# Patient Record
Sex: Male | Born: 1980 | Hispanic: No | Marital: Married | Smoking: Never smoker
Health system: Southern US, Community
[De-identification: ages and names within clinical notes are randomized; demographics above are authoritative.]

## PROBLEM LIST (undated history)

## (undated) HISTORY — PX: OTHER SURGICAL HISTORY: SHX169

## (undated) HISTORY — PX: NASAL SEPTUM SURGERY: SHX37

---

## 2011-10-10 ENCOUNTER — Emergency Department (HOSPITAL_COMMUNITY): Payer: PRIVATE HEALTH INSURANCE

## 2011-10-10 ENCOUNTER — Emergency Department (HOSPITAL_COMMUNITY)
Admission: EM | Admit: 2011-10-10 | Discharge: 2011-10-10 | Disposition: A | Discharge status: Home / Self Care | Payer: PRIVATE HEALTH INSURANCE | Attending: Emergency Medicine | Admitting: Emergency Medicine

## 2011-10-10 DIAGNOSIS — Z7982 Long term (current) use of aspirin: Secondary | ICD-10-CM | POA: Insufficient documentation

## 2011-10-10 DIAGNOSIS — R079 Chest pain, unspecified: Secondary | ICD-10-CM | POA: Insufficient documentation

## 2011-10-10 LAB — BASIC METABOLIC PANEL WITH GFR
CO2: 27 meq/L (ref 19–32)
Calcium: 10 mg/dL (ref 8.4–10.5)
Creatinine, Ser: 0.86 mg/dL (ref 0.50–1.35)
GFR calc Af Amer: >90 mL/min (ref >90)
Glucose, Bld: 99 mg/dL (ref 70–99)
Potassium: 4 meq/L (ref 3.5–5.1)

## 2011-10-10 LAB — DIFFERENTIAL
Basophils Absolute: 0 K/uL (ref 0.0–0.1)
Basophils Relative: 0 % (ref 0–1)
Eosinophils Absolute: 0.1 10*3/uL (ref 0.0–0.7)
Eosinophils Relative: 1 % (ref 0–5)
Lymphocytes Relative: 22 % (ref 12–46)
Lymphs Abs: 2.8 10*3/uL (ref 0.7–4.0)
Monocytes Absolute: 0.8 K/uL (ref 0.1–1.0)
Monocytes Relative: 6 % (ref 3–12)
Neutro Abs: 8.7 K/uL — ABNORMAL HIGH (ref 1.7–7.7)
Neutrophils Relative %: 71 % (ref 43–77)

## 2011-10-10 LAB — CBC
HCT: 44.7 % (ref 39.0–52.0)
Hemoglobin: 16.3 g/dL (ref 13.0–17.0)
MCH: 29.4 pg (ref 26.0–34.0)
MCHC: 36.5 g/dL — ABNORMAL HIGH (ref 30.0–36.0)
MCV: 80.7 fL (ref 78.0–100.0)
Platelets: 197 10*3/uL (ref 150–400)
RBC: 5.54 MIL/uL (ref 4.22–5.81)
RDW: 12.5 % (ref 11.5–15.5)
WBC: 12.3 10*3/uL — ABNORMAL HIGH (ref 4.0–10.5)

## 2011-10-10 LAB — BASIC METABOLIC PANEL
BUN: 14 mg/dL (ref 6–23)
Chloride: 101 mEq/L (ref 96–112)
GFR calc non Af Amer: >90 mL/min (ref >90)
Sodium: 138 mEq/L (ref 135–145)

## 2011-10-10 LAB — POCT I-STAT TROPONIN I: Troponin i, poc: 0 ng/mL (ref 0.00–0.08)

## 2015-04-03 ENCOUNTER — Telehealth: Payer: Self-pay | Admitting: Internal Medicine

## 2015-04-03 NOTE — Telephone Encounter (Signed)
yes

## 2015-04-03 NOTE — Telephone Encounter (Signed)
[facility]  He needs a compass referral to an ENT that he normally sees.  He does not have a primary care provider and is looking to establish care soon.  Is there any way you can take this patient on?

## 2015-04-04 NOTE — Telephone Encounter (Signed)
Left voicemail for patient to call back to schedule appointment.

## 2020-09-04 ENCOUNTER — Ambulatory Visit
Admission: RE | Admit: 2020-09-04 | Discharge: 2020-09-04 | Disposition: A | Discharge status: Home / Self Care | Payer: 59 | Source: Ambulatory Visit | Attending: Internal Medicine | Admitting: Internal Medicine

## 2020-09-04 ENCOUNTER — Other Ambulatory Visit: Payer: Self-pay

## 2020-09-04 ENCOUNTER — Other Ambulatory Visit: Payer: Self-pay | Admitting: Internal Medicine

## 2020-09-04 DIAGNOSIS — Z111 Encounter for screening for respiratory tuberculosis: Secondary | ICD-10-CM

## 2021-02-10 IMAGING — CR DG CHEST 2V
2 series · 2 of 2 positions shown · non-contrast
Comparison: Chest x-ray 10/10/2011.

CLINICAL DATA: 38-year-old male with history of positive TB test.
Currently asymptomatic.

EXAM:
CHEST - 2 VIEW

[w chest pa]
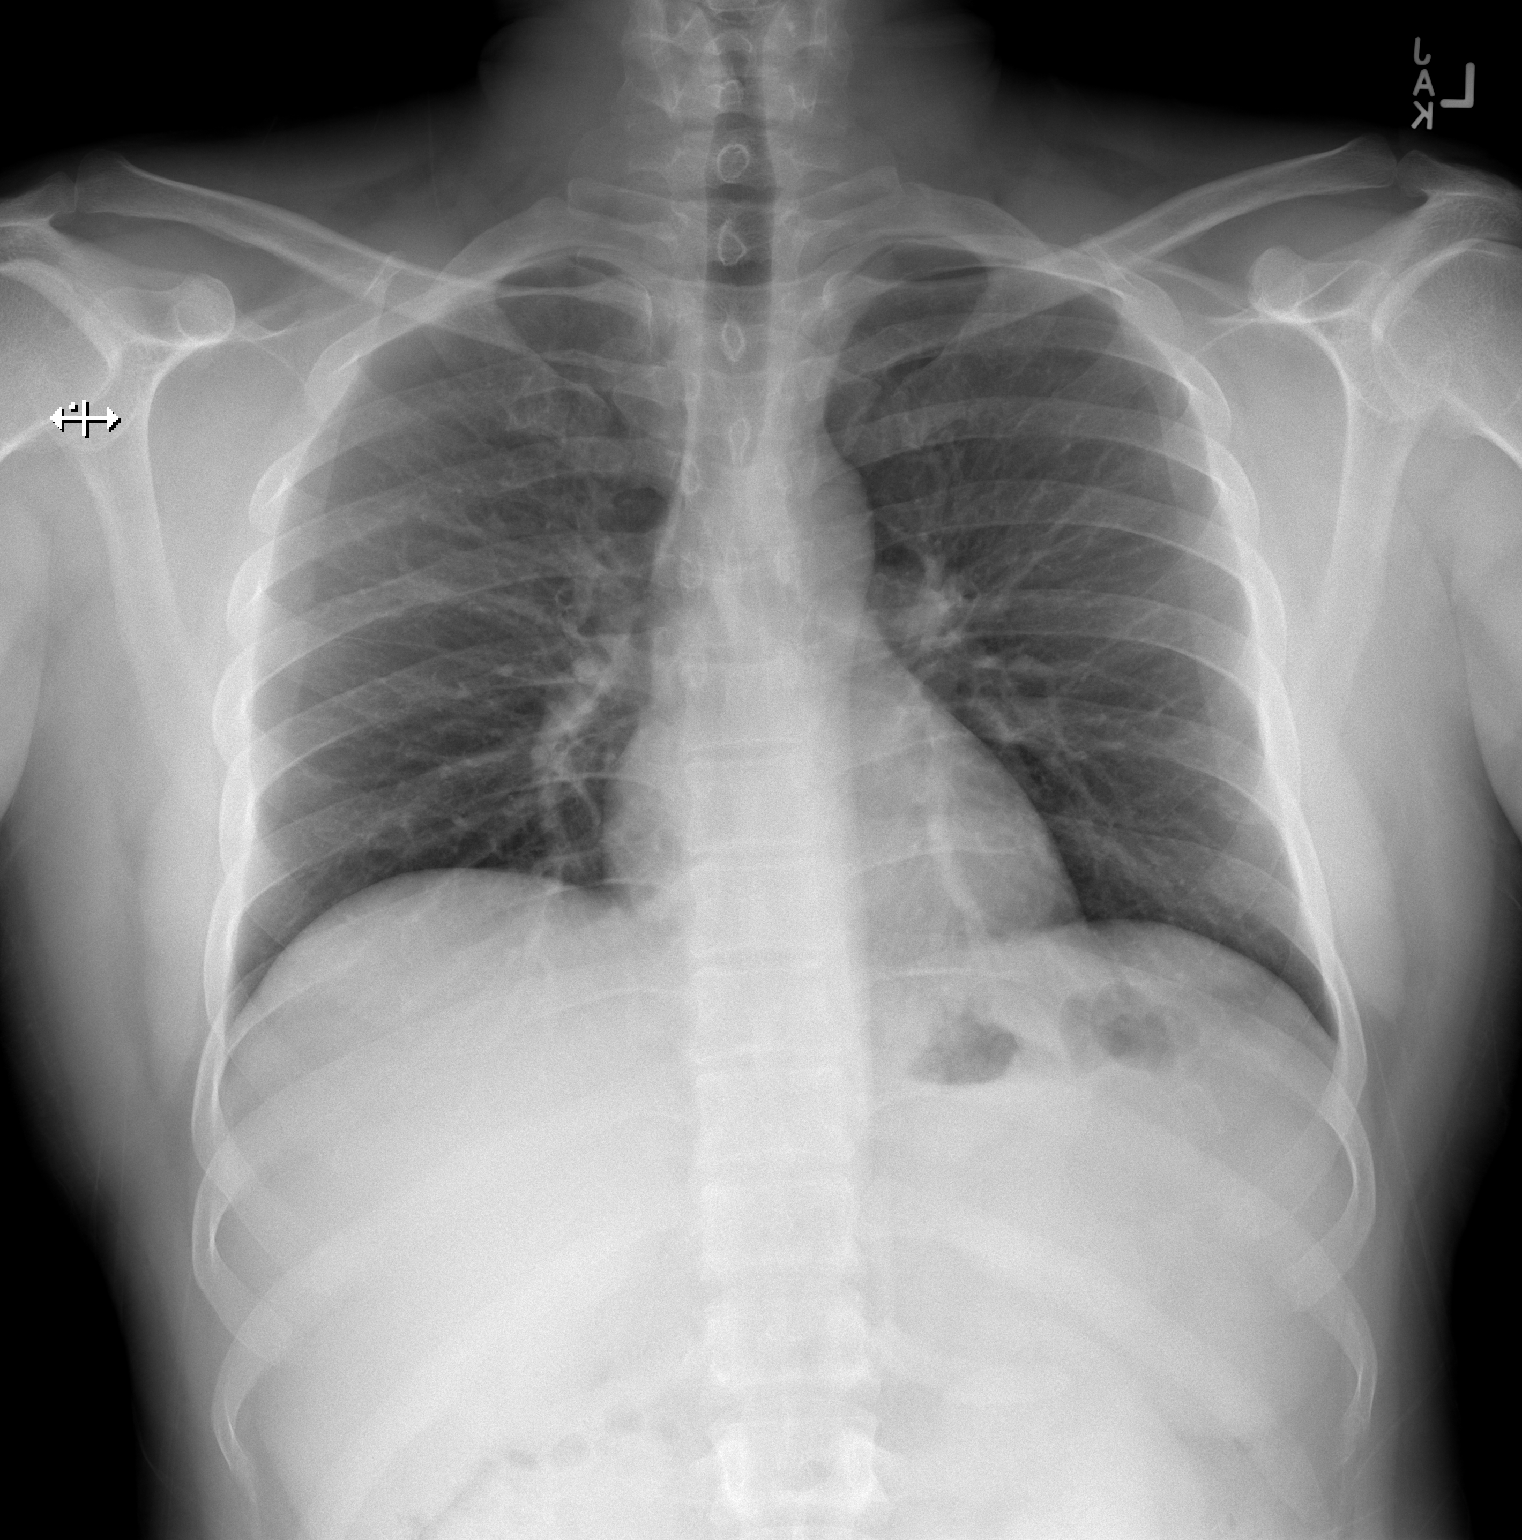

[w chest lat]
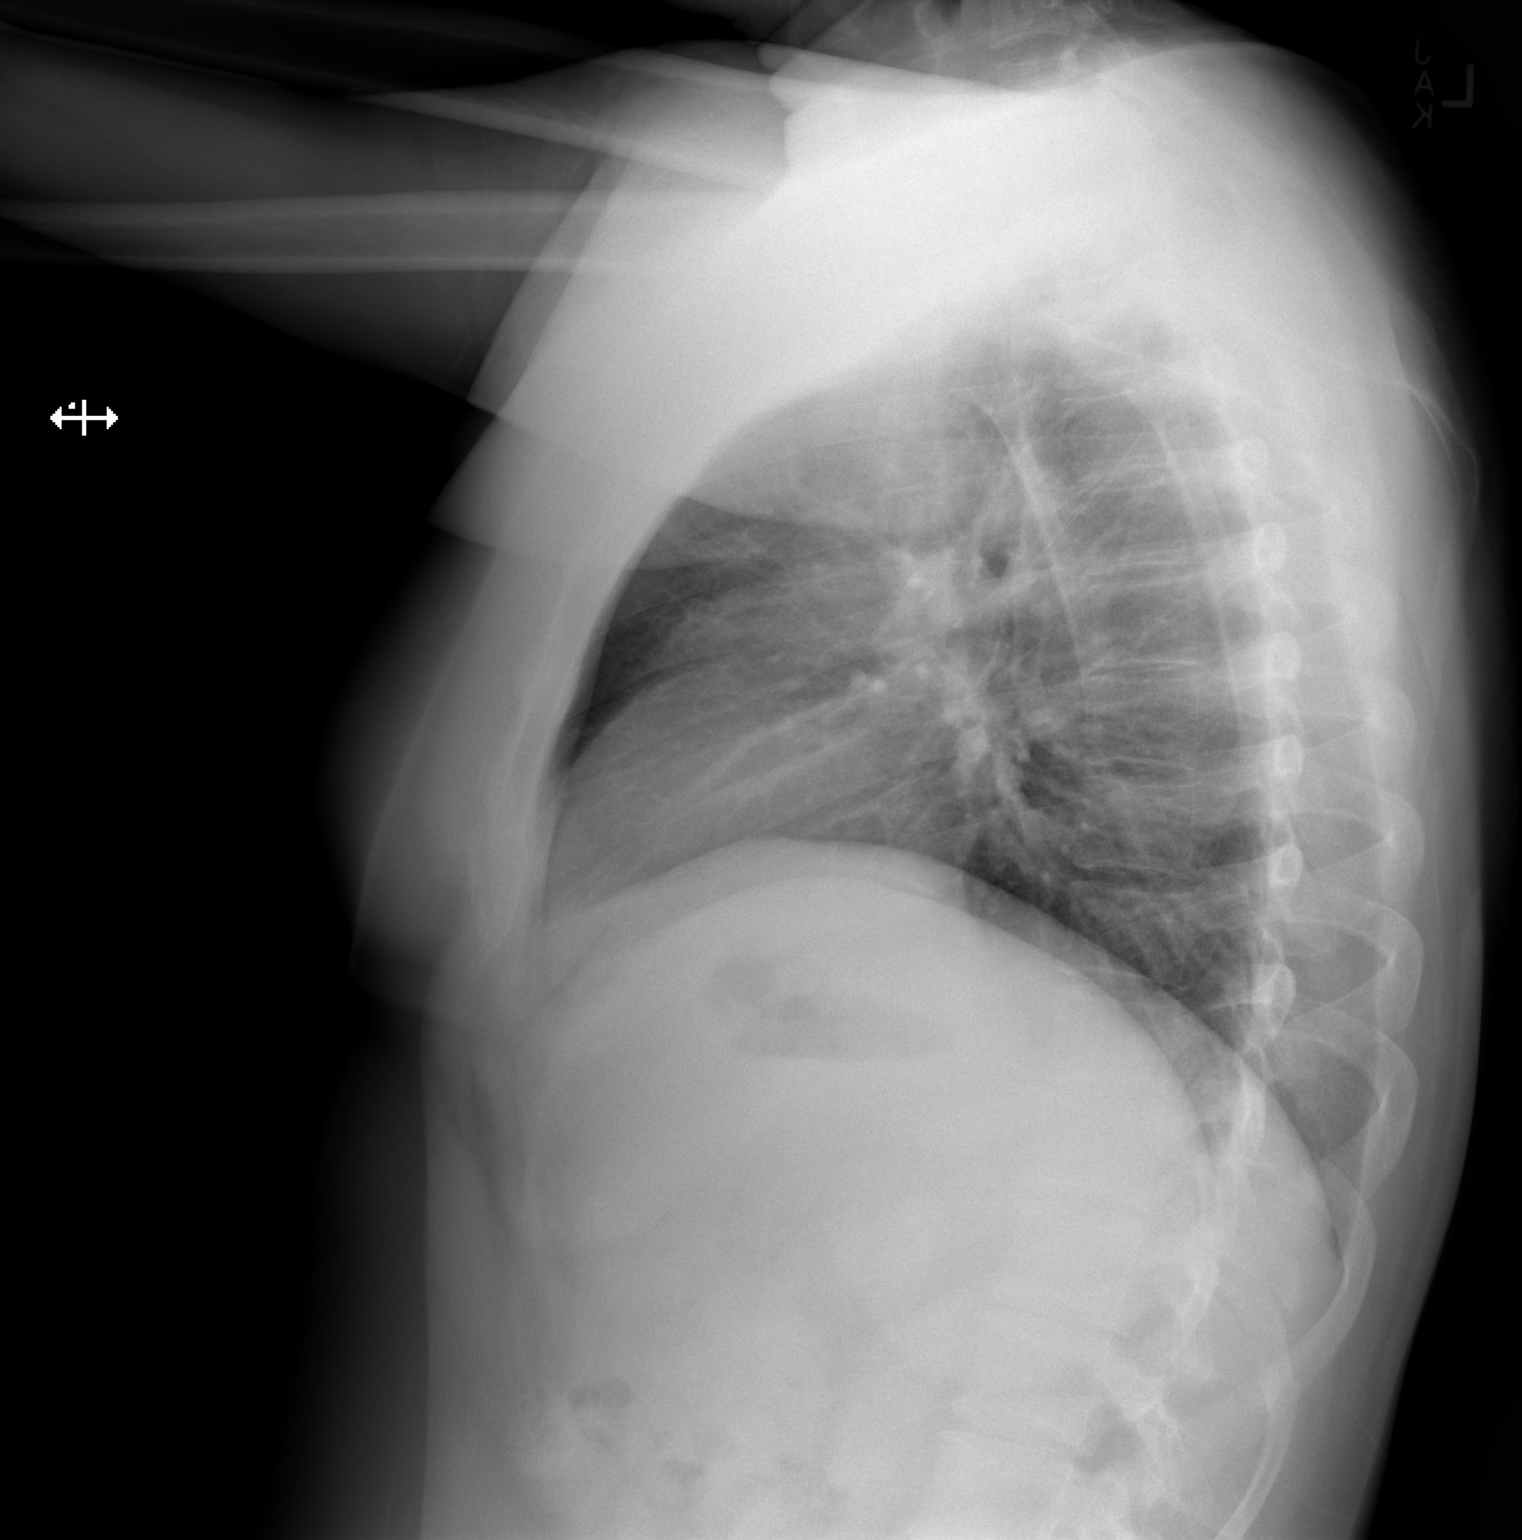

[2 of 2 positions shown; findings below may reference images not displayed]

FINDINGS: Lung volumes are normal. No consolidative airspace disease. No
pleural effusions. No pneumothorax. No pulmonary nodule or mass
noted. Pulmonary vasculature and the cardiomediastinal silhouette
are within normal limits.
IMPRESSION: 1. No radiographic evidence of active intrathoracic tuberculosis.

## 2024-06-28 ENCOUNTER — Ambulatory Visit

## 2024-06-28 ENCOUNTER — Telehealth: Payer: Self-pay

## 2024-06-28 ENCOUNTER — Ambulatory Visit: Payer: Self-pay | Admitting: Medical

## 2024-06-28 ENCOUNTER — Encounter: Payer: Self-pay | Admitting: Medical

## 2024-06-28 ENCOUNTER — Ambulatory Visit (INDEPENDENT_AMBULATORY_CARE_PROVIDER_SITE_OTHER): Admitting: Medical

## 2024-06-28 VITALS — BP 126/82 | HR 90 | Resp 18 | Ht 71.0 in | Wt 206.0 lb

## 2024-06-28 DIAGNOSIS — Z0001 Encounter for general adult medical examination with abnormal findings: Secondary | ICD-10-CM | POA: Diagnosis not present

## 2024-06-28 DIAGNOSIS — R5383 Other fatigue: Secondary | ICD-10-CM | POA: Diagnosis not present

## 2024-06-28 DIAGNOSIS — R739 Hyperglycemia, unspecified: Secondary | ICD-10-CM

## 2024-06-28 DIAGNOSIS — G629 Polyneuropathy, unspecified: Secondary | ICD-10-CM

## 2024-06-28 DIAGNOSIS — M25562 Pain in left knee: Secondary | ICD-10-CM

## 2024-06-28 DIAGNOSIS — R0789 Other chest pain: Secondary | ICD-10-CM | POA: Diagnosis not present

## 2024-06-28 DIAGNOSIS — Z13220 Encounter for screening for lipoid disorders: Secondary | ICD-10-CM

## 2024-06-28 DIAGNOSIS — Z0000 Encounter for general adult medical examination without abnormal findings: Secondary | ICD-10-CM

## 2024-06-28 LAB — CBC WITH DIFFERENTIAL/PLATELET
Basophils Absolute: 0 10*3/uL (ref 0.0–0.1)
Basophils Relative: 0.3 % (ref 0.0–3.0)
Eosinophils Absolute: 0.1 10*3/uL (ref 0.0–0.7)
Eosinophils Relative: 1 % (ref 0.0–5.0)
HCT: 46.8 % (ref 39.0–52.0)
Hemoglobin: 16 g/dL (ref 13.0–17.0)
Lymphocytes Relative: 28.8 % (ref 12.0–46.0)
Lymphs Abs: 1.8 10*3/uL (ref 0.7–4.0)
MCHC: 34.2 g/dL (ref 30.0–36.0)
MCV: 82.8 fl (ref 78.0–100.0)
Monocytes Absolute: 0.4 10*3/uL (ref 0.1–1.0)
Monocytes Relative: 6.5 % (ref 3.0–12.0)
Neutro Abs: 4 10*3/uL (ref 1.4–7.7)
Neutrophils Relative %: 63.4 % (ref 43.0–77.0)
Platelets: 239 10*3/uL (ref 150.0–400.0)
RBC: 5.65 Mil/uL (ref 4.22–5.81)
RDW: 13.5 % (ref 11.5–15.5)
WBC: 6.4 10*3/uL (ref 4.0–10.5)

## 2024-06-28 LAB — VITAMIN B12: Vitamin B-12: 179 pg/mL — ABNORMAL LOW (ref 211–911)

## 2024-06-28 LAB — TROPONIN I (HIGH SENSITIVITY): High Sens Troponin I: <2 ng/L (ref 2–17)

## 2024-06-28 LAB — COMPREHENSIVE METABOLIC PANEL WITH GFR
ALT: 38 U/L (ref 0–53)
AST: 23 U/L (ref 0–37)
Albumin: 4.8 g/dL (ref 3.5–5.2)
Alkaline Phosphatase: 45 U/L (ref 39–117)
BUN: 13 mg/dL (ref 6–23)
CO2: 27 meq/L (ref 19–32)
Calcium: 9.5 mg/dL (ref 8.4–10.5)
Chloride: 106 meq/L (ref 96–112)
Creatinine, Ser: 0.91 mg/dL (ref 0.40–1.50)
GFR: 105.28 mL/min (ref >60.00)
Glucose, Bld: 103 mg/dL — ABNORMAL HIGH (ref 70–99)
Potassium: 4.4 meq/L (ref 3.5–5.1)
Sodium: 139 meq/L (ref 135–145)
Total Bilirubin: 0.8 mg/dL (ref 0.2–1.2)
Total Protein: 7.8 g/dL (ref 6.0–8.3)

## 2024-06-28 LAB — LIPID PANEL
Cholesterol: 182 mg/dL (ref 0–200)
HDL: 47.6 mg/dL (ref >39.00)
LDL Cholesterol: 114 mg/dL — ABNORMAL HIGH (ref 0–99)
NonHDL: 134.39
Total CHOL/HDL Ratio: 4
Triglycerides: 101 mg/dL (ref 0.0–149.0)
VLDL: 20.2 mg/dL (ref 0.0–40.0)

## 2024-06-28 LAB — TSH: TSH: 1.41 u[IU]/mL (ref 0.35–5.50)

## 2024-06-28 LAB — T4, FREE: Free T4: 0.86 ng/dL (ref 0.60–1.60)

## 2024-06-28 NOTE — Addendum Note (Signed)
 Addended by: DORINA DALLAS HERO on: 06/28/2024 03:31 PM   Modules accepted: Orders

## 2024-06-28 NOTE — Telephone Encounter (Signed)
-----   Message from [facility] Lolita F sent at 06/28/2024  3:59 PM EDT ----- [name], can you help with this? ----- Message ----- From: Dorina Dallas RIGGERS Sent: 06/28/2024   3:30 PM EDT To: Lolita DELENA Libra, CMA; Almarie GRADE Rumple  Can we get A1c added to pt labs that were drawn. Dx elevated sugar.

## 2024-06-28 NOTE — Telephone Encounter (Signed)
 A1c placed and [facility] lab form faxed

## 2024-06-28 NOTE — Progress Notes (Signed)
 Subjective:    Patient ID: Jared Thompson, male    DOB: 1981-12-10, 43 y.o.   MRN: [id number]  HPI Pt in for first time. Discussed and decided to go ahead and get wellness exam. Pt is fasting.    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 Pt not vegitarian but no beef and work. Alcohol- 2 beers a week. Non smoker. No caffeine beverages.   Pt states 2 years ago got tetanus vaccine at [facility]. Pt states he is likely up to date on hep b vaccine as when he immigrated he had to show records and get some updates.   Pt  states he has left upper chest pectoralis transient  superficial burning sensation for past 6-7 years. Sometimes pain is up to 30 minutes and sometimes seconds. He states does not feel with exercise. No mid thoracic pain that radiates to anterior thorax. Pt never had shingles eruption. Pain for 2-3 months.  On review pt never has associated cardiac like symptoms. No mid chest pain, no shoulder pain, no jaw pain, no dyspnea or sweating. Stress at times makes it worse.   Pt states over past week he has had burning to left upper chest 1-2 times a day at times twice a day. Over past week. He has some now.  Also some clicking sensation to left knee in past when he walked a lot but no clicking sound.     Review of Systems  Constitutional:  Positive for fatigue.  HENT:  Negative for congestion and ear pain.   Respiratory:  Negative for cough, chest tightness, wheezing and stridor.   Cardiovascular:  Negative for chest pain and palpitations.       Very atypical chest pain.  Gastrointestinal:  Negative for abdominal pain, constipation, nausea and vomiting.  Musculoskeletal:  Negative for back pain.       See hpi.  Skin:  Negative for rash.  Neurological:  Negative for dizziness, weakness and numbness.       Neuropathy.  Hematological:  Negative for adenopathy. Does not bruise/bleed easily.  Psychiatric/Behavioral:  Negative for behavioral problems and decreased  concentration. The patient is not nervous/anxious and is not hyperactive.     History reviewed. No pertinent past medical history.   Social History   Socioeconomic History   Marital status: Married    Spouse name: Not on file   Number of children: 2   Years of education: Not on file   Highest education level: Not on file  Occupational History   Occupation: IT  Tobacco Use   Smoking status: Never   Smokeless tobacco: Never  Vaping Use   Vaping status: Never Used  Substance and Sexual Activity   Alcohol use: Yes    Alcohol/week: 2.0 standard drinks of alcohol    Types: 2 Cans of beer per week    Comment: per week   Drug use: Never   Sexual activity: Yes  Other Topics Concern   Not on file  Social History Narrative   Not on file   Social Drivers of Health   Financial Resource Strain: Not on file  Food Insecurity: Not on file  Transportation Needs: Not on file  Physical Activity: Not on file  Stress: Not on file  Social Connections: Not on file  Intimate Partner Violence: Not on file    Past Surgical History:  Procedure Laterality Date   NASAL SEPTUM SURGERY     ruptured ear drum surgery Left  History reviewed. No pertinent family history.  Not on File  No current outpatient medications on file prior to visit.   No current facility-administered medications on file prior to visit.    BP 126/82   Pulse 90   Resp 18   Ht 5' 11 (1.803 m)   Wt 206 lb (93.4 kg)   SpO2 97%   BMI 28.73 kg/m        Objective:   Physical Exam  General Mental Status- Alert. General Appearance- Not in acute distress.   Skin General: Color- Normal Color. Moisture- Normal Moisture.  Neck Carotid Arteries- Normal color. Moisture- Normal Moisture. No carotid bruits. No JVD.  Chest and Lung Exam Auscultation: Breath Sounds:-Normal.  Cardiovascular Auscultation:Rythm- Regular. Murmurs & Other Heart Sounds:Auscultation of the heart reveals- No  Murmurs.  Abdomen Inspection:-Inspeection Normal. Palpation/Percussion:Note:No mass. Palpation and Percussion of the abdomen reveal- Non Tender, Non Distended + BS, no rebound or guarding.    Neurologic Cranial Nerve exam:- CN III-XII intact(No nystagmus), symmetric smile. Drift Test:- No drift. Romberg Exam:- Negative.  Heal to Toe Gait exam:-Normal. Finger to Nose:- Normal/Intact Strength:- 5/5 equal and symmetric strength both upper and lower extremities.   Skin- no rash on left pec. No direct tenderness. When he presses on his chest relieves atypical burning sensation.      Assessment & Plan:   [phone]  For you wellness exam today I have ordered cbc, cmp and lipid panel.  Please get me vaccine records that you have  Recommend exercise and healthy diet.  We will let you know lab results as they come in.  Follow up date appointment will be determined after lab review.     Atypical chest pain with neuropathic features Burning pain in left upper pectoralis region for seven years, non-exertional, nerve-like features. Cardiac causes need exclusion but I think extremely unlikely based on feature or lack of. - Order EKG and cardiac protein study stat. -EKG nsr rhythm. No ischemic changes. No prior EKG to compare. - Prescribe tramadol, 9 tablets, one tablet every 8 hours as needed for nerve pain. Advise against driving or operating heavy machinery due to potential drowsiness. - Order B12 and B1 levels to assess for deficiencies contributing to neuropathic pain. - Instruct to seek emergency care if mid-chest pain, jaw pain, shortness of breath, or shoulder/arm pain occur. Pt expressed understanding.  Left knee pain with intermittent clicking Currently asymptomatic. Plan for imaging if symptoms recur. - Order left knee x-ray, valid for one year, if recurrent clicking or pain occurs.  Follow-up Follow-up based on lab results and tramadol response for burning pain. -  Update on lab results and discuss tramadol effectiveness after three days.  [id number] as did address atypical chest pain, neuropathy, fatigue and knee pain/clicking hx on new pt visit . In addition to wellness exam.  Dallas Maxwell, PA-C

## 2024-06-28 NOTE — Patient Instructions (Addendum)
 For you wellness exam today I have ordered cbc, cmp and lipid panel.  Please get me vaccine records that you have  Recommend exercise and healthy diet.  We will let you know lab results as they come in.  Follow up date appointment will be determined after lab review.     Atypical chest pain with neuropathic features Burning pain in left upper pectoralis region for seven years, non-exertional, nerve-like features. Cardiac causes need exclusion but I think extremely unlikely based on feature or lack of. - Order EKG and cardiac protein study stat. -EKG nsr rhythm. No ischemic changes. No prior EKG to compare. - Prescribe tramadol, 9 tablets, one tablet every 8 hours as needed for nerve pain. Advise against driving or operating heavy machinery due to potential drowsiness. - Order B12 and B1 levels to assess for deficiencies contributing to neuropathic pain. - Instruct to seek emergency care if mid-chest pain, jaw pain, shortness of breath, or shoulder/arm pain occur. Pt expressed understanding.  Left knee pain with intermittent clicking Currently asymptomatic. Plan for imaging if symptoms recur. - Order left knee x-ray, valid for one year, if recurrent clicking or pain occurs.  Follow-up Follow-up based on lab results and tramadol response for burning pain. - Update on lab results and discuss tramadol effectiveness after three days.   Preventive Care 87-61 Years Old, Male Preventive care refers to lifestyle choices and visits with your health care provider that can promote health and wellness. Preventive care visits are also called wellness exams. What can I expect for my preventive care visit? Counseling During your preventive care visit, your health care provider may ask about your: Medical history, including: Past medical problems. Family medical history. Current health, including: Emotional well-being. Home life and relationship well-being. Sexual activity. Lifestyle,  including: Alcohol, nicotine or tobacco, and drug use. Access to firearms. Diet, exercise, and sleep habits. Safety issues such as seatbelt and bike helmet use. Sunscreen use. Work and work Astronomer. Physical exam Your health care provider will check your: Height and weight. These may be used to calculate your BMI (body mass index). BMI is a measurement that tells if you are at a healthy weight. Waist circumference. This measures the distance around your waistline. This measurement also tells if you are at a healthy weight and may help predict your risk of certain diseases, such as type 2 diabetes and high blood pressure. Heart rate and blood pressure. Body temperature. Skin for abnormal spots. What immunizations do I need?  Vaccines are usually given at various ages, according to a schedule. Your health care provider will recommend vaccines for you based on your age, medical history, and lifestyle or other factors, such as travel or where you work. What tests do I need? Screening Your health care provider may recommend screening tests for certain conditions. This may include: Lipid and cholesterol levels. Diabetes screening. This is done by checking your blood sugar (glucose) after you have not eaten for a while (fasting). Hepatitis B test. Hepatitis C test. HIV (human immunodeficiency virus) test. STI (sexually transmitted infection) testing, if you are at risk. Lung cancer screening. Prostate cancer screening. Colorectal cancer screening. Talk with your health care provider about your test results, treatment options, and if necessary, the need for more tests. Follow these instructions at home: Eating and drinking  Eat a diet that includes fresh fruits and vegetables, whole grains, lean protein, and low-fat dairy products. Take vitamin and mineral supplements as recommended by your health care provider. Do not drink  alcohol if your health care provider tells you not to  drink. If you drink alcohol: Limit how much you have to 0-2 drinks a day. Know how much alcohol is in your drink. In the [place] one drink equals one 12 oz bottle of beer (355 mL), one 5 oz glass of wine (148 mL), or one 1 oz glass of hard liquor (44 mL). Lifestyle Brush your teeth every morning and night with fluoride toothpaste. Floss one time each day. Exercise for at least 30 minutes 5 or more days each week. Do not use any products that contain nicotine or tobacco. These products include cigarettes, chewing tobacco, and vaping devices, such as e-cigarettes. If you need help quitting, ask your health care provider. Do not use drugs. If you are sexually active, practice safe sex. Use a condom or other form of protection to prevent STIs. Take aspirin only as told by your health care provider. Make sure that you understand how much to take and what form to take. Work with your health care provider to find out whether it is safe and beneficial for you to take aspirin daily. Find healthy ways to manage stress, such as: Meditation, yoga, or listening to music. Journaling. Talking to a trusted person. Spending time with friends and family. Minimize exposure to UV radiation to reduce your risk of skin cancer. Safety Always wear your seat belt while driving or riding in a vehicle. Do not drive: If you have been drinking alcohol. Do not ride with someone who has been drinking. When you are tired or distracted. While texting. If you have been using any mind-altering substances or drugs. Wear a helmet and other protective equipment during sports activities. If you have firearms in your house, make sure you follow all gun safety procedures. What's next? Go to your health care provider once a year for an annual wellness visit. Ask your health care provider how often you should have your eyes and teeth checked. Stay up to date on all vaccines. This information is not intended to replace advice  given to you by your health care provider. Make sure you discuss any questions you have with your health care provider. Document Revised: 06/11/2021 Document Reviewed: 06/11/2021 Elsevier Patient Education  2024 [employer].

## 2024-06-29 LAB — HEMOGLOBIN A1C: Hgb A1c MFr Bld: 5.4 % (ref 4.6–6.5)

## 2024-07-02 LAB — VITAMIN B1: Vitamin B1 (Thiamine): 6 nmol/L — ABNORMAL LOW (ref 8–30)
